# Patient Record
Sex: Male | Born: 1965
Health system: Southern US, Community
[De-identification: ages and names within clinical notes are randomized; demographics above are authoritative.]

## PROBLEM LIST (undated history)

## (undated) DIAGNOSIS — K219 Gastro-esophageal reflux disease without esophagitis: Secondary | ICD-10-CM

## (undated) DIAGNOSIS — Z8619 Personal history of other infectious and parasitic diseases: Secondary | ICD-10-CM

## (undated) HISTORY — DX: Gastro-esophageal reflux disease without esophagitis: K21.9

## (undated) HISTORY — DX: Personal history of other infectious and parasitic diseases: Z86.19

## (undated) HISTORY — PX: NO PAST SURGERIES: SHX2092

---

## 2016-03-16 ENCOUNTER — Ambulatory Visit: Payer: Self-pay | Admitting: Family Medicine

## 2017-05-02 ENCOUNTER — Telehealth: Payer: Self-pay | Admitting: Family Medicine

## 2017-05-04 ENCOUNTER — Telehealth: Payer: Self-pay

## 2017-05-04 NOTE — Telephone Encounter (Signed)
Pre visit call completed 

## 2017-05-05 ENCOUNTER — Ambulatory Visit (INDEPENDENT_AMBULATORY_CARE_PROVIDER_SITE_OTHER): Payer: Commercial Managed Care - PPO | Admitting: Family Medicine

## 2017-05-05 ENCOUNTER — Encounter: Payer: Self-pay | Admitting: Family Medicine

## 2017-05-05 VITALS — BP 120/70 | HR 85 | Temp 98.7°F | Ht 68.0 in | Wt 167.6 lb

## 2017-05-05 DIAGNOSIS — R42 Dizziness and giddiness: Secondary | ICD-10-CM | POA: Diagnosis not present

## 2017-05-05 DIAGNOSIS — R109 Unspecified abdominal pain: Secondary | ICD-10-CM | POA: Diagnosis not present

## 2017-05-05 DIAGNOSIS — Z8379 Family history of other diseases of the digestive system: Secondary | ICD-10-CM | POA: Diagnosis not present

## 2017-05-05 LAB — T4, FREE: Free T4: 0.88 ng/dL (ref 0.60–1.60)

## 2017-05-05 LAB — TSH: TSH: 1.24 u[IU]/mL (ref 0.35–4.50)

## 2017-05-05 NOTE — Patient Instructions (Addendum)
If you do not hear anything about your referral in the next 1-2 weeks, call our office and ask for an update.  Give us 4-5 business days to get the results of your labs back. I will reach out sooner if anything is abnormal with your thyroid studies.  Feel free to schedule appt with us for a complete physical at your earliest convenience as long as it has been at least a year since your last one.   Stay well hydrated and keep your salt intake up as you have been.

## 2017-05-05 NOTE — Progress Notes (Signed)
Chief Complaint  Patient presents with  . Establish Care    pt want to discuss fatigue,dizziness(every time he stands)x 6-8 weeks       New Patient Visit SUBJECTIVE: HPI: Sean Mckenzie is an 51 y.o.male who is being seen for establishing care.  The patient was previously seen at [facility]- his wife and son are seen by Dr. Abner Greenspan of this office.  Patient has an 8 week history of lightheadedness every time he rises. It lasts for several seconds and then resolves spontaneously. It is affecting his ability to stay active and work out. He is an avid marathon runner and triathlete. He denies any dizziness/spinning and has not passed out. 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 None of these have been helpful. He does not notice an increased incidence in relation to his meals. He denies any vision changes, trouble with speech, difficult swallowing, numbness, tingling, weakness, chest pain, shortness of breath, or headache. He had a fever of 102F at the end of April, and has not had any issues with this since. He does report an unintentional weight loss of around 15 pounds. He did have lab work done at his previous PCPs office prior to coming here. He had a negative CBC, CMP, Lyme disease titer, [facility] spotted fever disease titer. I do not see a thyroid panel or any autoimmune studies.  The patient has also noticed some abdominal discomfort. It is generalized and will come and go. His son and his father have Crohn's disease. While he denies any diarrhea, blood in stool, or dark tarry stools, he is very concerned about this. He has never had a colonoscopy before.   No Known Allergies  Past Medical History:  Diagnosis Date  . GERD (gastroesophageal reflux disease)   . History of chicken pox    Past Surgical History:  Procedure Laterality Date  . NO PAST SURGERIES     Social History   Social History   . Marital status: Married   Social History Main Topics  . Smoking status: Never Smoker  . Smokeless tobacco: Never Used  . Alcohol use 2.4 oz/week    4 Glasses of wine per week  . Drug use: No   Family History  Problem Relation Age of Onset  . Crohn's disease Mother   . Crohn's disease Son    Leanora Ivanoff no medications routinely.  ROS Cardiovascular: Denies chest pain  Respiratory: Denies dyspnea   OBJECTIVE: BP 120/70 (BP Location: Left Arm, Patient Position: Sitting, Cuff Size: Normal)   Pulse 85   Temp 98.7 F (37.1 C) (Oral)   Ht 5\' 8"  (1.727 m)   Wt 167 lb 9.6 oz (76 kg)   SpO2 99%   BMI 25.48 kg/m   Constitutional: -  VS reviewed -  Well developed, well nourished, appears stated age -  No apparent distress  Psychiatric: -  Oriented to person, place, and time -  Memory intact -  Affect and mood normal -  Fluent conversation, good eye contact -  Judgment and insight age appropriate  Eye: -  Conjunctivae clear, no discharge -  Pupils symmetric, round, reactive to light  ENMT: -  Ears are patent b/l without erythema or discharge. TM's are shiny and clear b/l without evidence of effusion or infection. -  Oral mucosa without lesions, tongue and uvula midline    Tonsils not enlarged, no erythema, no exudate, trachea  midline    Pharynx moist, no lesions, no erythema  Neck: -  No gross swelling, no palpable masses -  Thyroid midline, not enlarged, mobile, no palpable masses  Cardiovascular: -  RRR, no murmurs -  No LE edema  Respiratory: -  Normal respiratory effort, no accessory muscle use, no retraction -  Breath sounds equal, no wheezes, no ronchi, no crackles  Gastrointestinal: -  Bowel sounds normal -  No tenderness, no distention, no guarding, no masses  Neurological:  -  CN II - XII grossly intact -  3/4 patellar reflex, 2/4 calcaneal and biceps reflex b/l, no clonus, no cerebellar signs -  Fluent speech -  Neg Dix-[place] b/l  Musculoskeletal: -  No  clubbing, no cyanosis -  Gait normal -  5/5 strength throughout  Skin: -  No significant lesion on inspection -  Warm and dry to palpation   ASSESSMENT/PLAN: Lightheadedness - Plan: Ambulatory referral to Cardiology, TSH, T4, free, ANA,IFA RA Diag Pnl w/rflx Tit/Patn, EKG 12-Lead  Family history of Crohn's disease - Plan: Ambulatory referral to Gastroenterology  Abdominal discomfort - Plan: Ambulatory referral to Gastroenterology  Patient instructed to sign release of records form from his previous PCP. Orthostatics and EKG neg. Will complete lab workup with autoimmune studies and thyroid testing.  Refer to GI for possible colonoscopy (meets new guideline of colonoscopy at 45). Refer to cardio for eval, possible stress test.  Sounds like POTS vs dysautonomia, encouraged hydration and salt intake.  Patient should return prn at this point for CPE once he is 1 year out. The patient voiced understanding and agreement to the plan.   Jilda Rocheicholas Paul Mont BelvieuWendling, DO 05/05/17  3:39 PM

## 2017-05-06 ENCOUNTER — Telehealth: Payer: Self-pay | Admitting: Family Medicine

## 2017-05-06 LAB — ANA,IFA RA DIAG PNL W/RFLX TIT/PATN
ANA: NEGATIVE
CYCLIC CITRULLIN PEPTIDE AB: 20 U — AB
Rhuematoid fact SerPl-aCnc: <14 IU/mL (ref <14)

## 2017-05-06 MED ORDER — DICYCLOMINE HCL 20 MG PO TABS: 20.0000 mg | ORAL_TABLET | Freq: Three times a day (TID) | ORAL | 0 refills | Status: DC

## 2017-05-06 NOTE — Telephone Encounter (Signed)
Will forward request to referral team.   Keep a symptom diary. Will call in a medicine to see if that helps with the pain. If still having issues next week, schedule an appointment for a second look. TY.

## 2017-05-06 NOTE — Telephone Encounter (Signed)
Called and spoke with the pt and informed him of the message below.   Pt verbalized understanding an agreed.//AB/CMA

## 2017-05-06 NOTE — Telephone Encounter (Signed)
Relation to pt: self  Call back number: [phone]  Pharmacy:  Reason for call :  Patient wanted to inform Dr. Carmelia RollerWendling right side pain has worsened in need of clinical advice and would like [facility]: Charna ElizabethMann Sean Mckenzie [address], [place], [place] [address] [phone]  instead of [facility]

## 2017-05-09 ENCOUNTER — Encounter (HOSPITAL_BASED_OUTPATIENT_CLINIC_OR_DEPARTMENT_OTHER): Payer: Self-pay | Admitting: *Deleted

## 2017-05-09 ENCOUNTER — Emergency Department (HOSPITAL_BASED_OUTPATIENT_CLINIC_OR_DEPARTMENT_OTHER)
Admission: EM | Admit: 2017-05-09 | Discharge: 2017-05-10 | Disposition: A | Discharge status: Home / Self Care | Payer: Commercial Managed Care - PPO | Attending: Emergency Medicine | Admitting: Emergency Medicine

## 2017-05-09 DIAGNOSIS — R1011 Right upper quadrant pain: Secondary | ICD-10-CM | POA: Diagnosis not present

## 2017-05-09 DIAGNOSIS — R109 Unspecified abdominal pain: Secondary | ICD-10-CM | POA: Diagnosis present

## 2017-05-09 LAB — BASIC METABOLIC PANEL
ANION GAP: 8 (ref 5–15)
BUN: 13 mg/dL (ref 6–20)
CHLORIDE: 103 mmol/L (ref 101–111)
CO2: 27 mmol/L (ref 22–32)
CREATININE: 0.91 mg/dL (ref 0.61–1.24)
Calcium: 9 mg/dL (ref 8.9–10.3)
GFR calc Af Amer: >60 mL/min (ref >60)
GFR calc non Af Amer: >60 mL/min (ref >60)
GLUCOSE: 102 mg/dL — AB (ref 65–99)
Potassium: 3.6 mmol/L (ref 3.5–5.1)
Sodium: 138 mmol/L (ref 135–145)

## 2017-05-09 LAB — CBC WITH DIFFERENTIAL/PLATELET
Basophils Absolute: 0 10*3/uL (ref 0.0–0.1)
Basophils Relative: 1 %
Eosinophils Absolute: 0.1 10*3/uL (ref 0.0–0.7)
Eosinophils Relative: 2 %
HEMATOCRIT: 40.1 % (ref 39.0–52.0)
HEMOGLOBIN: 14 g/dL (ref 13.0–17.0)
LYMPHS ABS: 2.2 10*3/uL (ref 0.7–4.0)
Lymphocytes Relative: 38 %
MCH: 31 pg (ref 26.0–34.0)
MCHC: 34.9 g/dL (ref 30.0–36.0)
MCV: 88.7 fL (ref 78.0–100.0)
MONO ABS: 0.5 10*3/uL (ref 0.1–1.0)
MONOS PCT: 8 %
NEUTROS ABS: 2.9 10*3/uL (ref 1.7–7.7)
NEUTROS PCT: 51 %
Platelets: 292 10*3/uL (ref 150–400)
RBC: 4.52 MIL/uL (ref 4.22–5.81)
RDW: 12 % (ref 11.5–15.5)
WBC: 5.7 10*3/uL (ref 4.0–10.5)

## 2017-05-09 LAB — AMYLASE: Amylase: 44 U/L (ref 28–100)

## 2017-05-09 LAB — LIPASE, BLOOD: Lipase: 38 U/L (ref 11–51)

## 2017-05-09 NOTE — ED Triage Notes (Signed)
Right upper quadrant pain. He was seen here 4 days ago for undiagnosed lower abdominal pain.

## 2017-05-10 ENCOUNTER — Emergency Department (HOSPITAL_BASED_OUTPATIENT_CLINIC_OR_DEPARTMENT_OTHER): Payer: Commercial Managed Care - PPO

## 2017-05-10 ENCOUNTER — Other Ambulatory Visit: Payer: Self-pay | Admitting: Gastroenterology

## 2017-05-10 DIAGNOSIS — R1033 Periumbilical pain: Secondary | ICD-10-CM

## 2017-05-10 LAB — HEPATIC FUNCTION PANEL
ALT: 14 U/L — ABNORMAL LOW (ref 17–63)
AST: 18 U/L (ref 15–41)
Albumin: 4.3 g/dL (ref 3.5–5.0)
Alkaline Phosphatase: 44 U/L (ref 38–126)
Bilirubin, Direct: <0.1 mg/dL — ABNORMAL LOW (ref 0.1–0.5)
TOTAL PROTEIN: 6.8 g/dL (ref 6.5–8.1)
Total Bilirubin: 0.3 mg/dL (ref 0.3–1.2)

## 2017-05-10 MED ORDER — IOPAMIDOL (ISOVUE-300) INJECTION 61%
100.0000 mL | Freq: Once | INTRAVENOUS | Status: AC | PRN
  Administered 2017-05-10: 100 mL via INTRAVENOUS

## 2017-05-10 NOTE — ED Provider Notes (Signed)
[facility] Provider Note: Sean DellJ. Lane Sean Botting, MD, FACEP  CSN: [id number] MRN: [id number] ARRIVAL: 05/09/17 at 2102 ROOM: MH02/MH02   CHIEF COMPLAINT  Abdominal Pain   HISTORY OF PRESENT ILLNESS  Sean Mckenzie is a 51 y.o. male with a three-month history of vaguely described malaise. He has been having generalized abdominal discomfort and transient lightheadedness when bending over. He has had a weight loss during this same period of time. He previously was a runner but has not felt like running for the past several months. He was seen in the clinic upstairs on the 14th of this month; lab work was reportedly unremarkable. He was referred to Dr. Loreta AveMann of gastroenterology and he has an appointment with her this afternoon at 4 PM. He is here now because of several days of right upper quadrant pain. The pain was initially intermittent but is now fairly constant. He rates it as a 4 out of 10 but it is worse after eating. The pain radiates to his right flank and right shoulder. It is a dull pain. It is somewhat worse with movement or palpation. He denies associated nausea, vomiting or diarrhea.   Past Medical History:  Diagnosis Date  . GERD (gastroesophageal reflux disease)   . History of chicken pox     Past Surgical History:  Procedure Laterality Date  . NO PAST SURGERIES      Family History  Problem Relation Age of Onset  . Crohn's disease Mother   . Crohn's disease Son     Social History  Substance Use Topics  . Smoking status: Never Smoker  . Smokeless tobacco: Never Used  . Alcohol use 2.4 oz/week    4 Glasses of wine per week    Prior to Admission medications   Medication Sig Start Date End Date Taking? Authorizing Provider  dicyclomine (BENTYL) 20 MG tablet Take 1 tablet (20 mg total) by mouth 3 (three) times daily before meals. 05/06/17   Sharlene DoryWendling, Nicholas Paul, DO    Allergies Patient has no known allergies.   REVIEW OF SYSTEMS  Negative except as noted  here or in the History of Present Illness.   PHYSICAL EXAMINATION  Initial Vital Signs Blood pressure (!) 131/91, pulse (!) 54, resp. rate 18, height 5\' 8"  (1.727 m), weight 75.8 kg (167 lb), SpO2 100 %.  Examination General: Well-developed, well-nourished male in no acute distress; appearance consistent with age of record HENT: normocephalic; atraumatic Eyes: pupils equal, round and reactive to light; extraocular muscles intact Neck: supple Heart: regular rate and rhythm Lungs: clear to auscultation bilaterally Abdomen: soft; nondistended; mild right upper quadrant tenderness; no masses or hepatosplenomegaly; bowel sounds present; no gallstones seen on bedside ultrasound Extremities: No deformity; full range of motion; pulses normal Neurologic: Awake, alert and oriented; motor function intact in all extremities and symmetric; no facial droop Skin: Warm and dry Psychiatric: Normal mood and affect   RESULTS  Summary of this visit's results, reviewed by myself:   EKG Interpretation  Date/Time:    Ventricular Rate:    PR Interval:    QRS Duration:   QT Interval:    QTC Calculation:   R Axis:     Text Interpretation:        Laboratory Studies: Results for orders placed or performed during the hospital encounter of 05/09/17 (from the past 24 hour(s))  Basic metabolic panel     Status: Abnormal   Collection Time: 05/09/17  9:16 PM  Result Value Ref Range  Sodium 138 135 - 145 mmol/L   Potassium 3.6 3.5 - 5.1 mmol/L   Chloride 103 101 - 111 mmol/L   CO2 27 22 - 32 mmol/L   Glucose, Bld 102 (H) 65 - 99 mg/dL   BUN 13 6 - 20 mg/dL   Creatinine, Ser 1.61 0.61 - 1.24 mg/dL   Calcium 9.0 8.9 - 09.6 mg/dL   GFR calc non Af Amer >60 >60 mL/min   GFR calc Af Amer >60 >60 mL/min   Anion gap 8 5 - 15  CBC with Differential     Status: None   Collection Time: 05/09/17  9:16 PM  Result Value Ref Range   WBC 5.7 4.0 - 10.5 K/uL   RBC 4.52 4.22 - 5.81 MIL/uL   Hemoglobin 14.0  13.0 - 17.0 g/dL   HCT 04.5 40.9 - 81.1 %   MCV 88.7 78.0 - 100.0 fL   MCH 31.0 26.0 - 34.0 pg   MCHC 34.9 30.0 - 36.0 g/dL   RDW 91.4 78.2 - 95.6 %   Platelets 292 150 - 400 K/uL   Neutrophils Relative % 51 %   Neutro Abs 2.9 1.7 - 7.7 K/uL   Lymphocytes Relative 38 %   Lymphs Abs 2.2 0.7 - 4.0 K/uL   Monocytes Relative 8 %   Monocytes Absolute 0.5 0.1 - 1.0 K/uL   Eosinophils Relative 2 %   Eosinophils Absolute 0.1 0.0 - 0.7 K/uL   Basophils Relative 1 %   Basophils Absolute 0.0 0.0 - 0.1 K/uL  Amylase     Status: None   Collection Time: 05/09/17  9:16 PM  Result Value Ref Range   Amylase 44 28 - 100 U/L  Lipase, blood     Status: None   Collection Time: 05/09/17  9:16 PM  Result Value Ref Range   Lipase 38 11 - 51 U/L  Hepatic function panel     Status: Abnormal   Collection Time: 05/09/17  9:16 PM  Result Value Ref Range   Total Protein 6.8 6.5 - 8.1 g/dL   Albumin 4.3 3.5 - 5.0 g/dL   AST 18 15 - 41 U/L   ALT 14 (L) 17 - 63 U/L   Alkaline Phosphatase 44 38 - 126 U/L   Total Bilirubin 0.3 0.3 - 1.2 mg/dL   Bilirubin, Direct <2.1 (L) 0.1 - 0.5 mg/dL   Indirect Bilirubin NOT CALCULATED 0.3 - 0.9 mg/dL   Imaging Studies: Ct Abdomen Pelvis W Contrast  Result Date: 05/10/2017 CLINICAL DATA:  51 year old male with right-sided abdominal pain. Family history of Crohn's disease. EXAM: CT ABDOMEN AND PELVIS WITH CONTRAST TECHNIQUE: Multidetector CT imaging of the abdomen and pelvis was performed using the standard protocol following bolus administration of intravenous contrast. CONTRAST:  ISOVUE-300 IOPAMIDOL (ISOVUE-300) INJECTION 61% COMPARISON:  None. FINDINGS: Lower chest: The visualized lung bases are clear. No intra-abdominal free air or free fluid. Hepatobiliary: No focal liver abnormality is seen. No gallstones, gallbladder wall thickening, or biliary dilatation. Pancreas: Unremarkable. No pancreatic ductal dilatation or surrounding inflammatory changes. Spleen: Normal  in size without focal abnormality. Adrenals/Urinary Tract: Adrenal glands are unremarkable. Kidneys are normal, without renal calculi, focal lesion, or hydronephrosis. Bladder is unremarkable. Stomach/Bowel: There is sigmoid diverticulosis without active inflammatory changes. Moderate amount of stool noted in the distal colon and rectosigmoid. There is no evidence of bowel obstruction or active inflammation. Normal appendix. Vascular/Lymphatic: No significant vascular findings are present. No enlarged abdominal or pelvic lymph nodes. Reproductive: The  prostate and seminal vesicles are grossly unremarkable. Other: No abdominal wall hernia or abnormality. No abdominopelvic ascites. Musculoskeletal: No acute or significant osseous findings. IMPRESSION: No acute intra-abdominopelvic pathology. Colonic diverticulosis. No evidence of bowel obstruction or active inflammation. Normal appendix. Electronically Signed   By: Elgie Collard M.D.   On: 05/10/2017 03:35    ED COURSE  Nursing notes and initial vitals signs, including pulse oximetry, reviewed.  Vitals:   05/09/17 2109 05/10/17 0029  BP:  (!) 131/91  Pulse:  (!) 54  Resp:  18  SpO2:  100%  Weight: 75.8 kg (167 lb)   Height: 5\' 8"  (1.727 m)    Patient advised of reassuring CT scans and lab work. He will follow-up with Dr. Loreta Ave this afternoon as scheduled.  PROCEDURES    ED DIAGNOSES     ICD-10-CM   1. Right upper quadrant abdominal pain R10.11        Tiyanna Larcom, MD 05/10/17 0405

## 2017-05-10 NOTE — ED Notes (Addendum)
Patient denies of n/v.  Pt also stated that [facility] April 2018, [facility], he has traces of blood in his urine.

## 2017-05-12 ENCOUNTER — Ambulatory Visit (HOSPITAL_COMMUNITY)
Admission: RE | Admit: 2017-05-12 | Discharge: 2017-05-12 | Disposition: A | Discharge status: Home / Self Care | Payer: Commercial Managed Care - PPO | Source: Ambulatory Visit | Attending: Gastroenterology | Admitting: Gastroenterology

## 2017-05-12 DIAGNOSIS — R1033 Periumbilical pain: Secondary | ICD-10-CM | POA: Diagnosis not present

## 2017-05-16 ENCOUNTER — Encounter (HOSPITAL_COMMUNITY): Payer: Commercial Managed Care - PPO

## 2017-05-23 ENCOUNTER — Encounter (HOSPITAL_COMMUNITY): Payer: Commercial Managed Care - PPO

## 2017-12-08 IMAGING — US US ABDOMEN COMPLETE
1 series · 14 of 25 positions shown · non-contrast
Comparison: CT scan of May 10, 2017.

CLINICAL DATA: Periumbilical abdominal pain for 1 month.

EXAM:
ABDOMEN ULTRASOUND COMPLETE

[Series 1: us abdomen complete · 0.23mm/px · 14 of 99 slices shown]
[im 1/99]
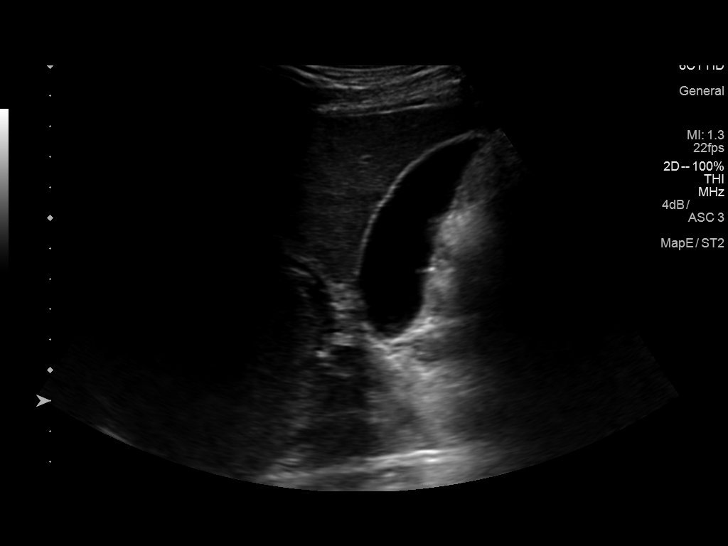
[im 9/99]
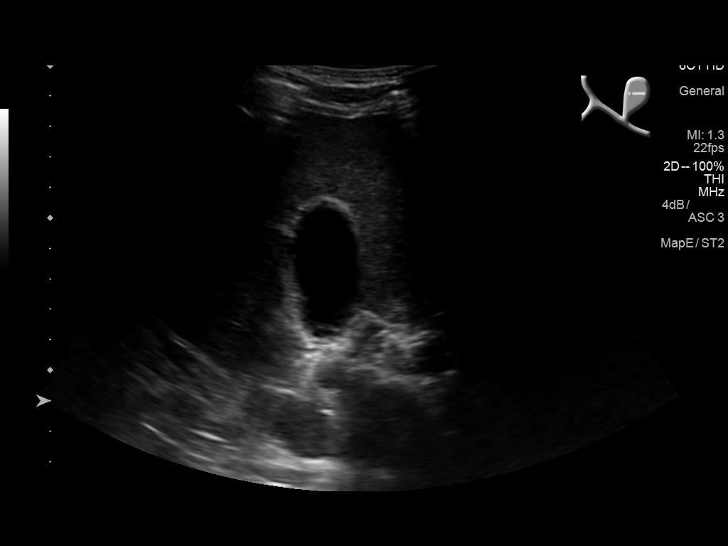
[im 17/99]
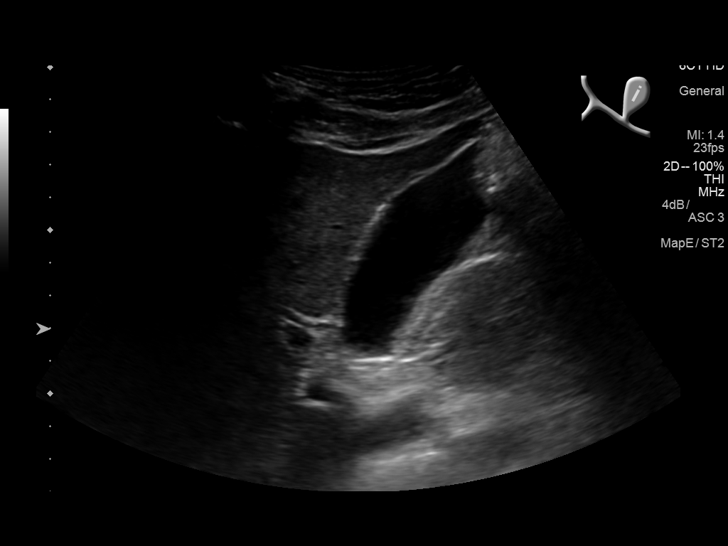
[im 25/99]
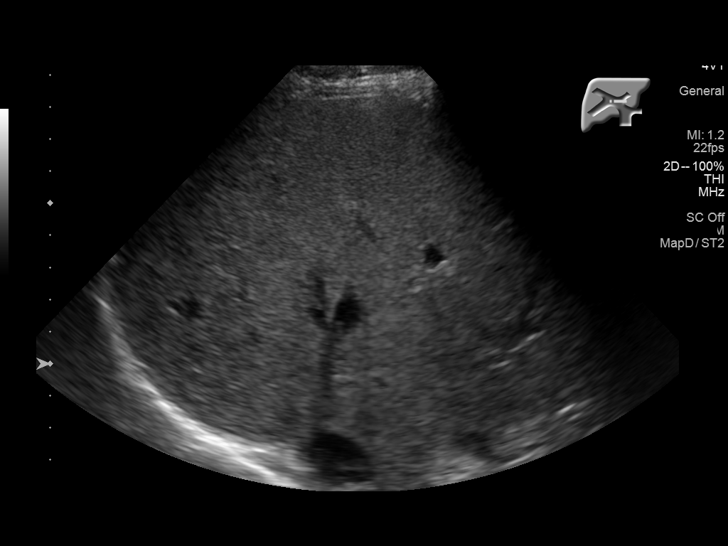
[im 33/99]
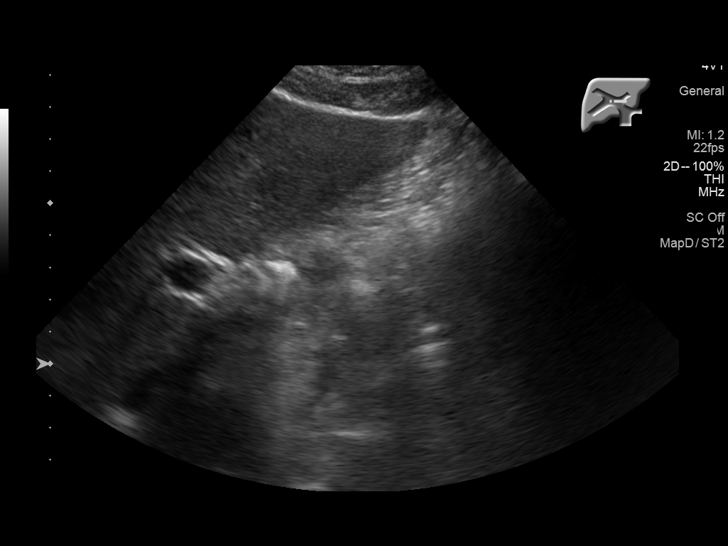
[im 37/99]
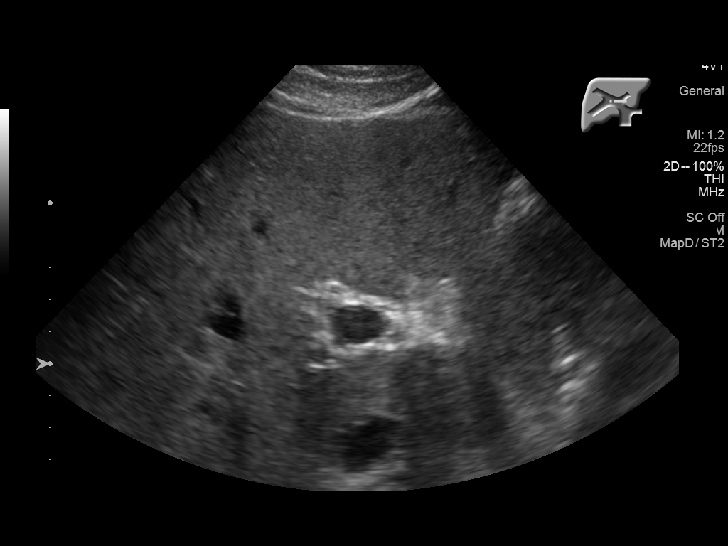
[im 45/99]
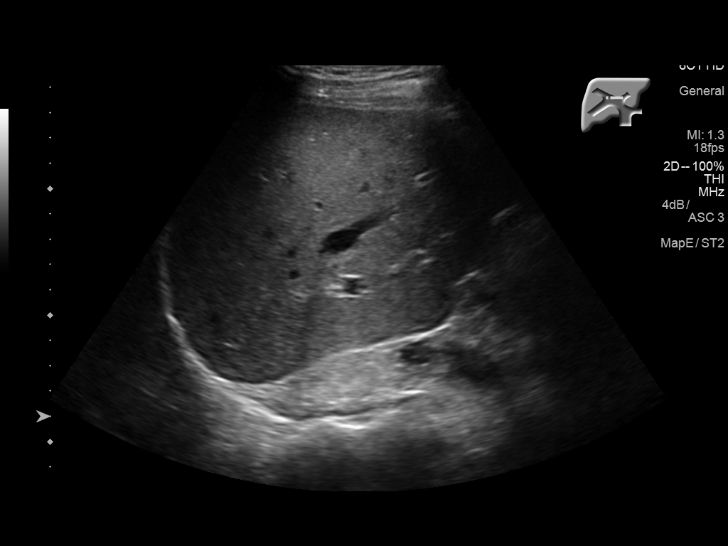
[im 54/99]
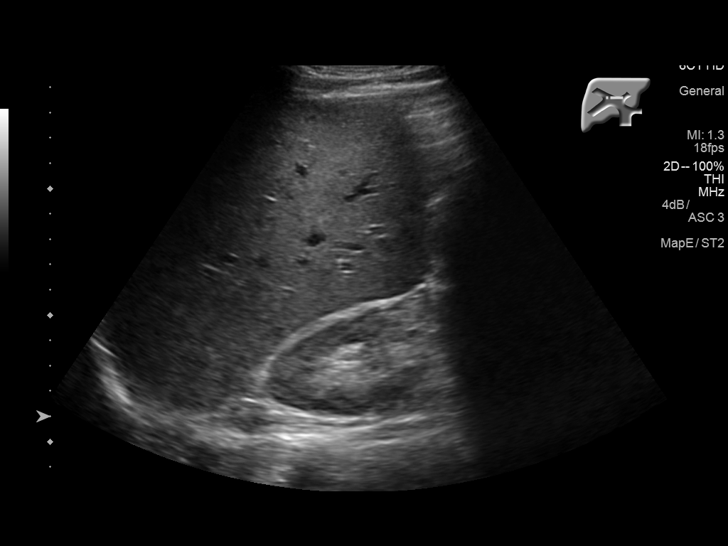
[im 62/99]
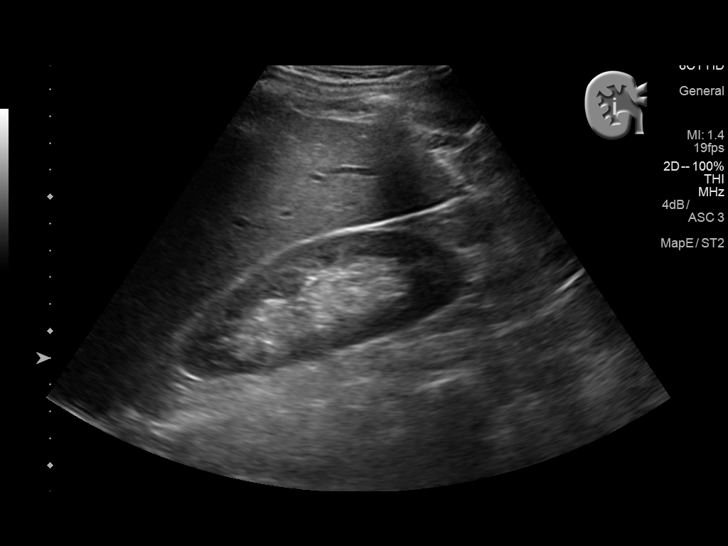
[im 66/99]
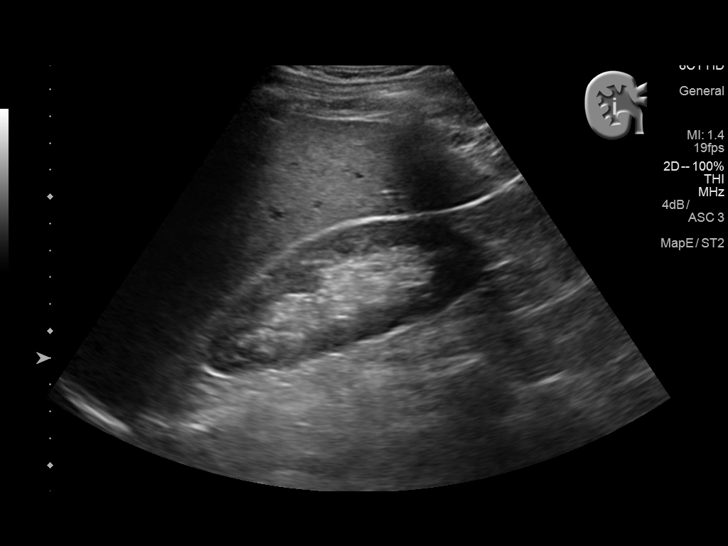
[im 74/99]
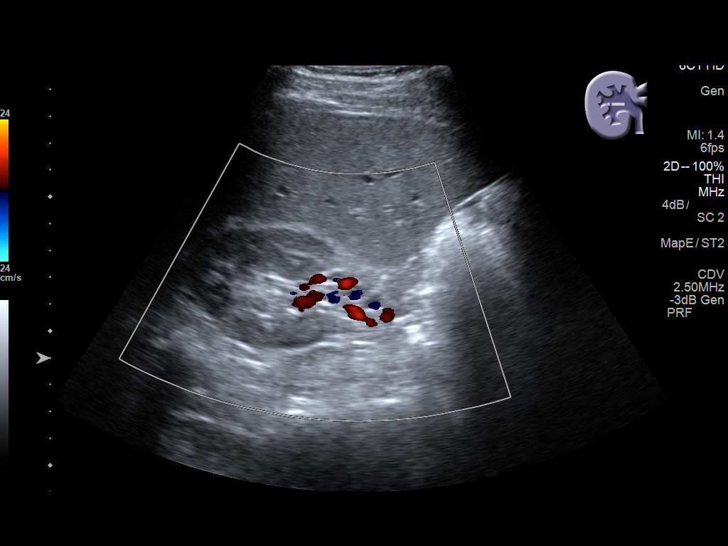
[im 82/99]
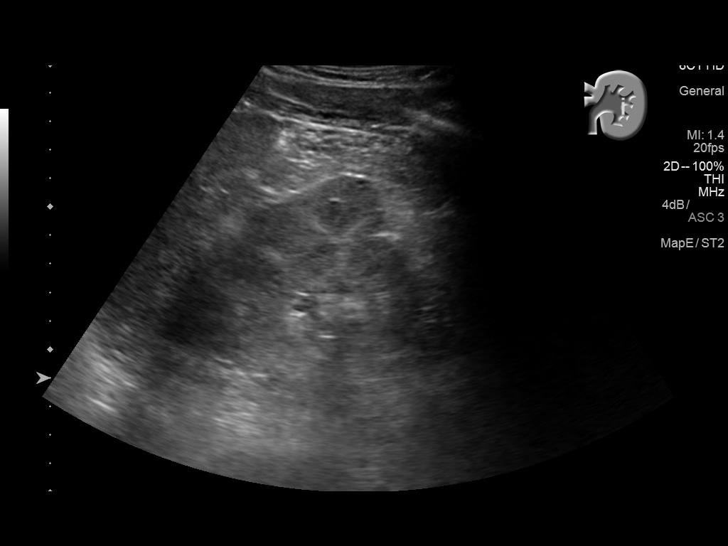
[im 90/99]
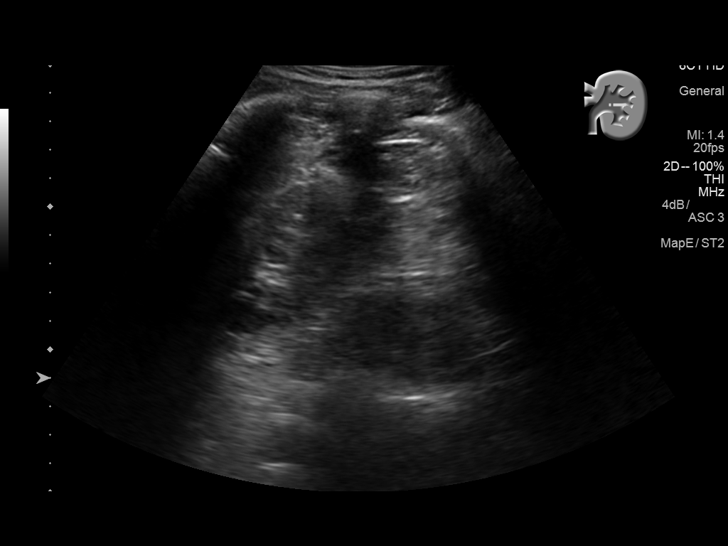
[im 99/99]
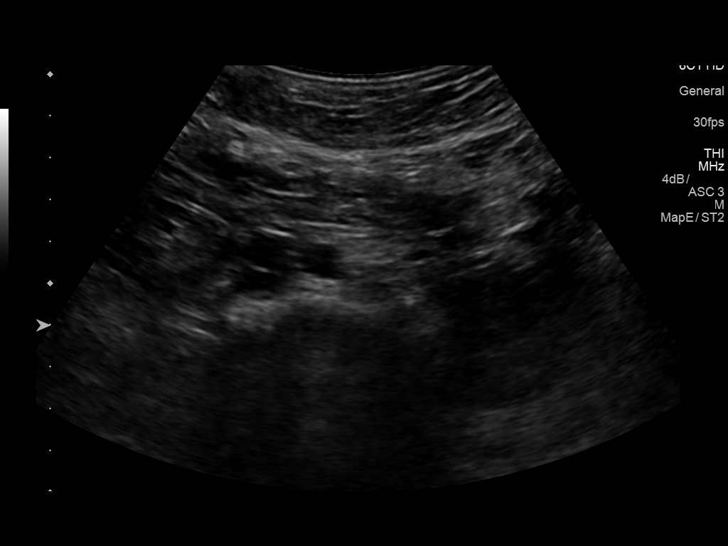

[14 of 25 positions shown; findings below may reference images not displayed]

FINDINGS: Gallbladder: No gallstones or wall thickening visualized. No
sonographic Murphy sign noted by sonographer.

Common bile duct: Diameter: 4.6 mm which is within normal limits.

Liver: No focal lesion identified. Within normal limits in
parenchymal echogenicity.

IVC: No abnormality visualized.

Pancreas: Visualized portion unremarkable.

Spleen: Size and appearance within normal limits.

Right Kidney: Length: 11.7 cm. Echogenicity within normal limits. No
mass or hydronephrosis visualized.

Left Kidney: Length: 11.5 cm. Echogenicity within normal limits. No
mass or hydronephrosis visualized.

Abdominal aorta: No aneurysm visualized.

Other findings: None.
IMPRESSION: No abnormality seen in the abdomen.

## 2018-04-23 IMAGING — CT CT ABD-PELV W/ CM
2 of 5 series · 15 of 46 positions shown, 17 images · IV contrast (APPLIED)
Comparison: None.

CLINICAL DATA: 50-year-old male with right-sided abdominal pain.
Family history of Crohn's disease.

EXAM:
CT ABDOMEN AND PELVIS WITH CONTRAST
TECHNIQUE: Multidetector CT imaging of the abdomen and pelvis was performed
using the standard protocol following bolus administration of
intravenous contrast.
CONTRAST:  100mL HAOTZ5-UZZ IOPAMIDOL (HAOTZ5-UZZ) INJECTION 61%

[Series 2: axial st · axial · 0.77mm/px · z∈[-499,-34]mm · 12 of 105 slices shown, 14 images]
[im 6/105  soft-tissue]
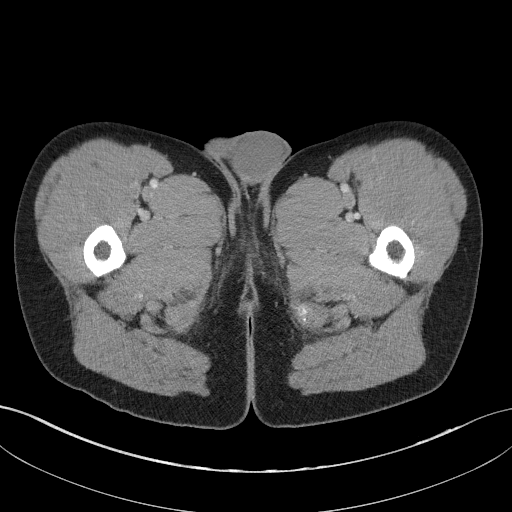
[im 6/105  bone]
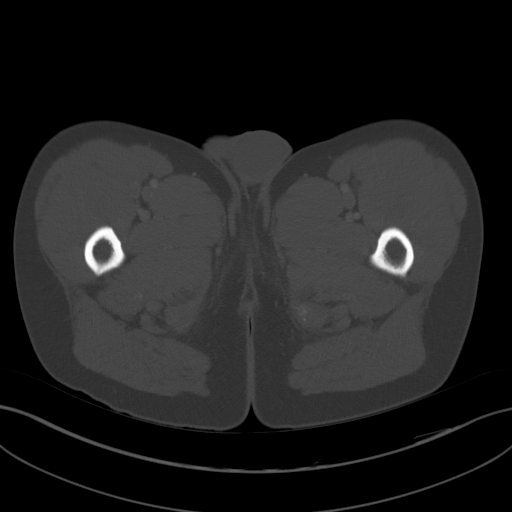
[im 17/105  soft-tissue]
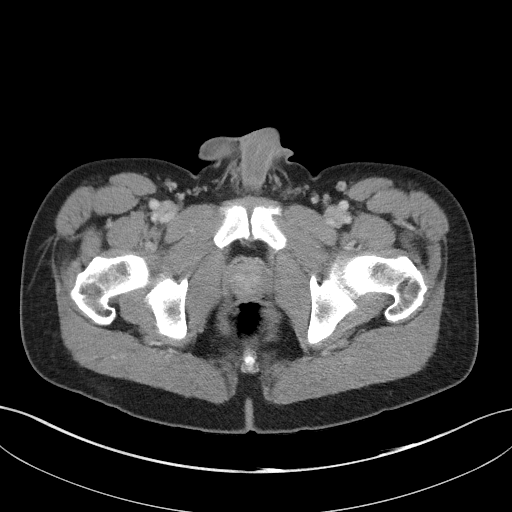
[im 22/105  soft-tissue]
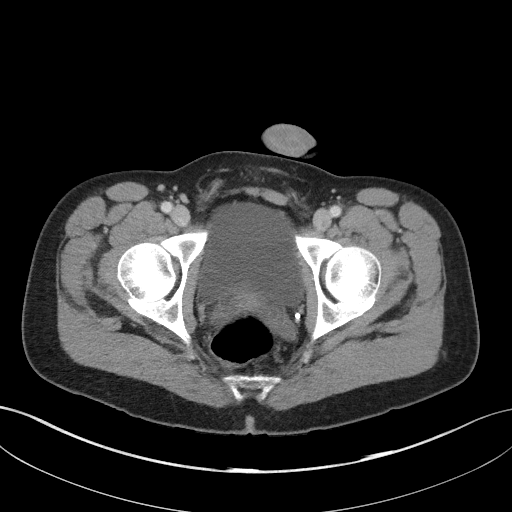
[im 33/105  soft-tissue]
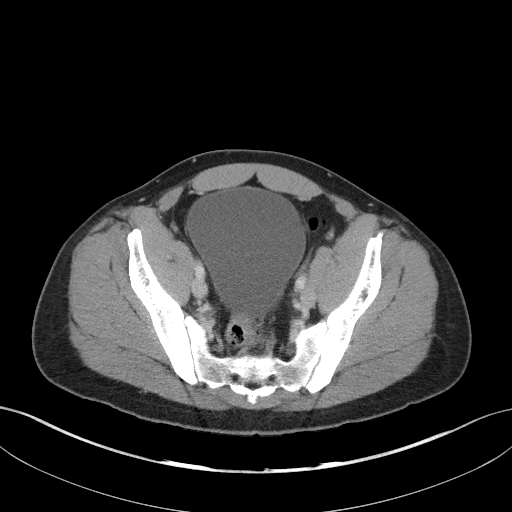
[im 39/105  soft-tissue]
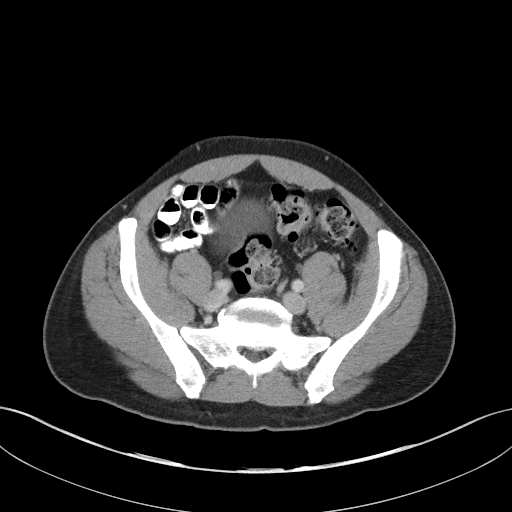
[im 50/105  soft-tissue]
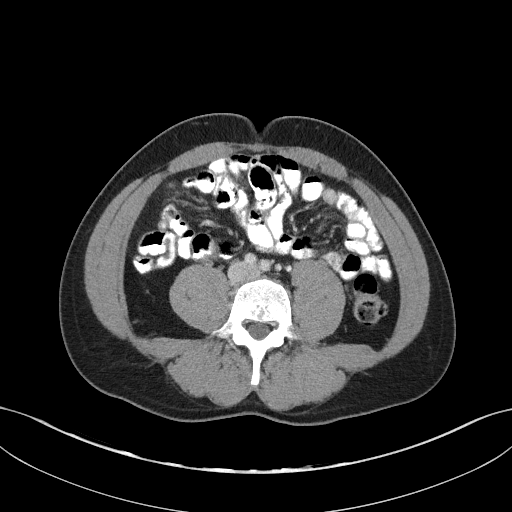
[im 55/105  soft-tissue]
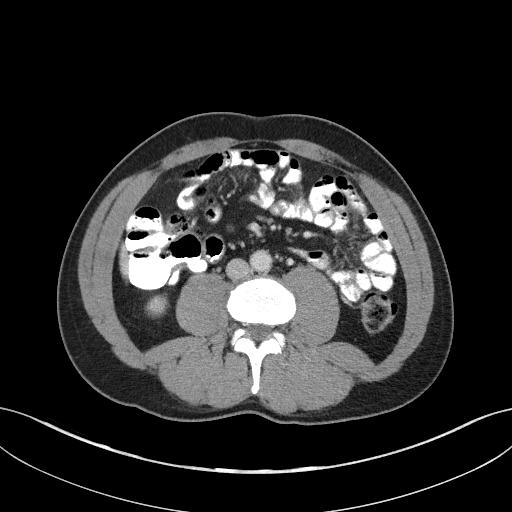
[im 66/105  soft-tissue]
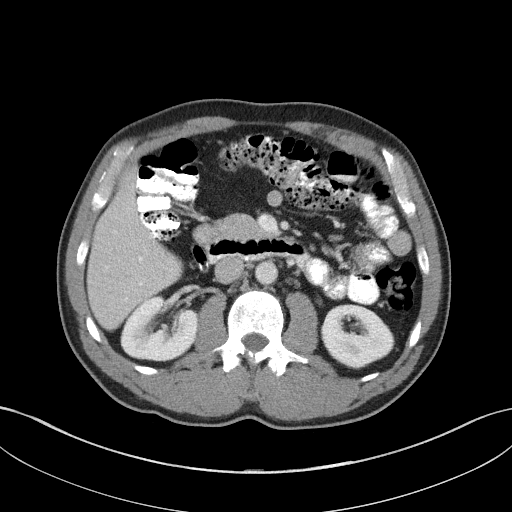
[im 72/105  soft-tissue]
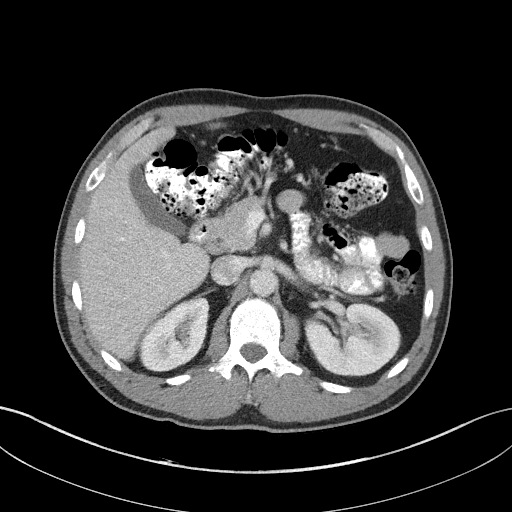
[im 72/105  bone]
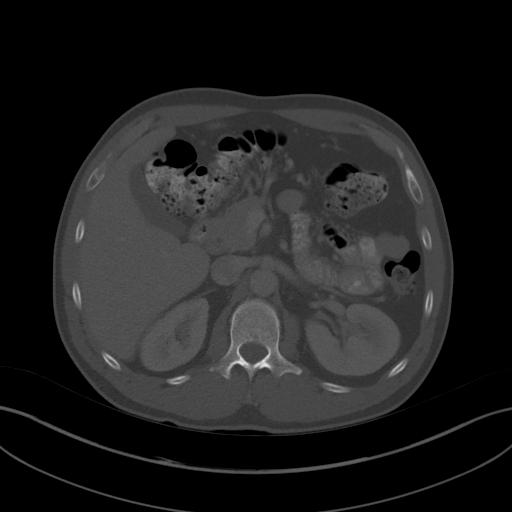
[im 83/105  soft-tissue]
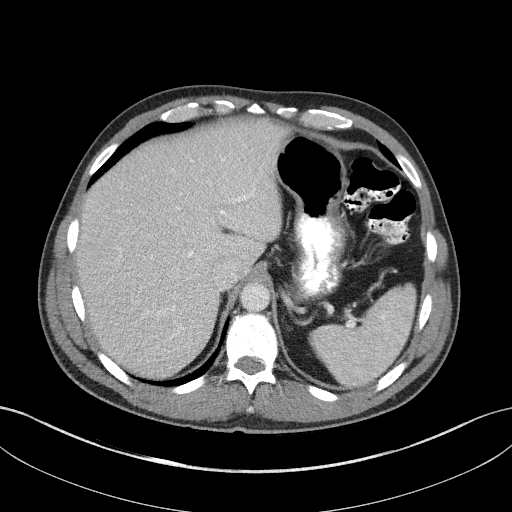
[im 88/105  soft-tissue]
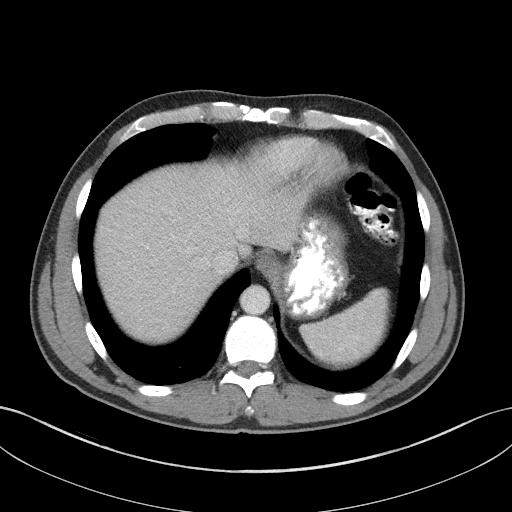
[im 99/105  soft-tissue]
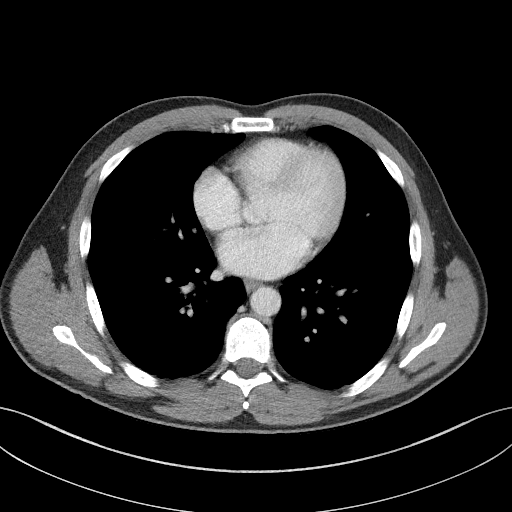

[Series 5: coronal st · coronal · 0.70mm/px · 3 of 83 slices shown]
[im 28/83  soft-tissue]
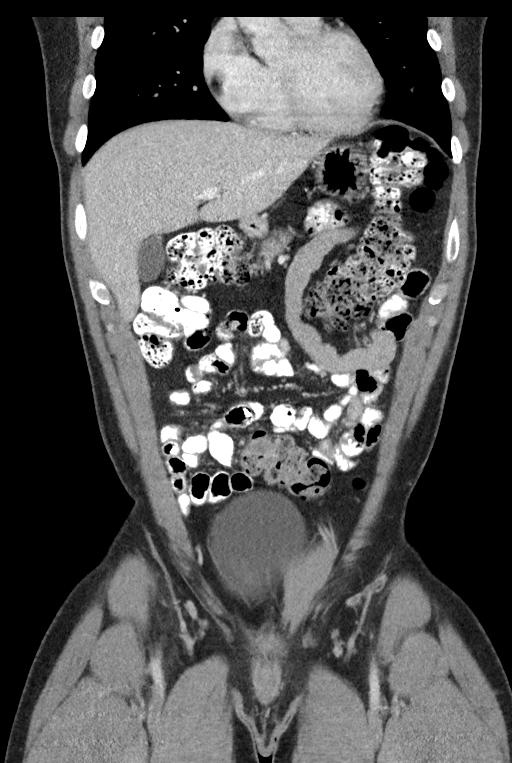
[im 37/83  soft-tissue]
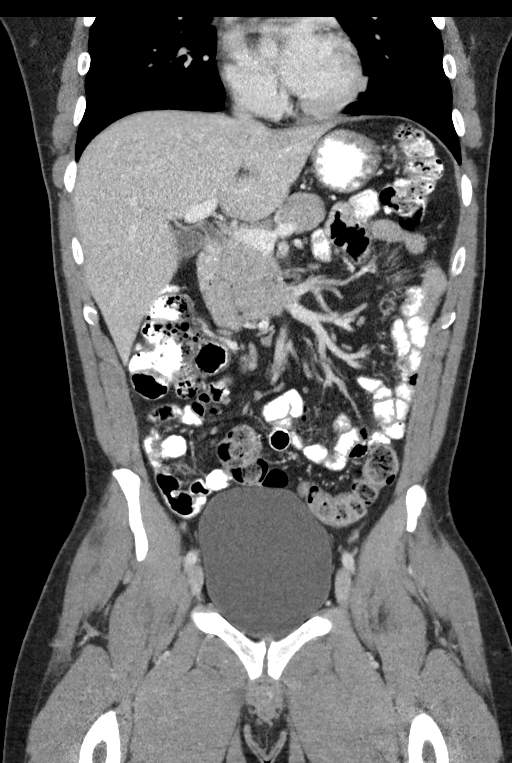
[im 46/83  soft-tissue]
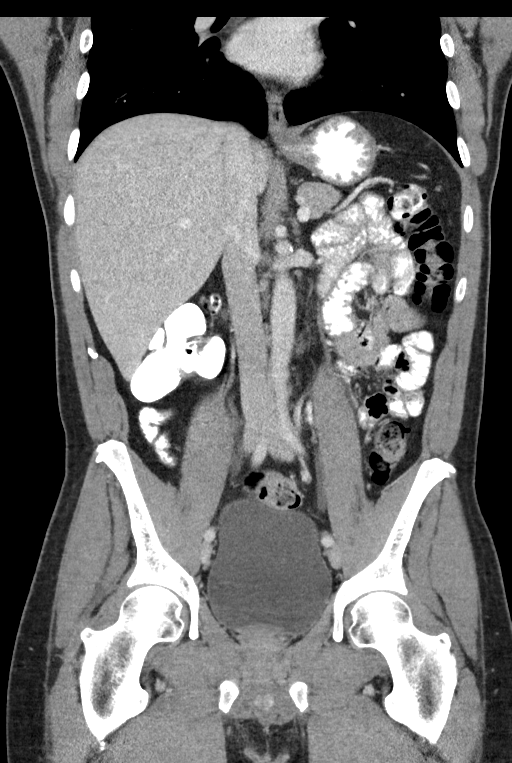

[15 of 46 positions shown; findings below may reference images not displayed]

FINDINGS: Lower chest: The visualized lung bases are clear.

No intra-abdominal free air or free fluid.

Hepatobiliary: No focal liver abnormality is seen. No gallstones,
gallbladder wall thickening, or biliary dilatation.

Pancreas: Unremarkable. No pancreatic ductal dilatation or
surrounding inflammatory changes.

Spleen: Normal in size without focal abnormality.

Adrenals/Urinary Tract: Adrenal glands are unremarkable. Kidneys are
normal, without renal calculi, focal lesion, or hydronephrosis.
Bladder is unremarkable.

Stomach/Bowel: There is sigmoid diverticulosis without active
inflammatory changes. Moderate amount of stool noted in the distal
colon and rectosigmoid. There is no evidence of bowel obstruction or
active inflammation. Normal appendix.

Vascular/Lymphatic: No significant vascular findings are present. No
enlarged abdominal or pelvic lymph nodes.

Reproductive: The prostate and seminal vesicles are grossly
unremarkable.

Other: No abdominal wall hernia or abnormality. No abdominopelvic
ascites.

Musculoskeletal: No acute or significant osseous findings.
IMPRESSION: No acute intra-abdominopelvic pathology. Colonic diverticulosis. No
evidence of bowel obstruction or active inflammation. Normal
appendix.

## 2018-05-08 ENCOUNTER — Ambulatory Visit (INDEPENDENT_AMBULATORY_CARE_PROVIDER_SITE_OTHER): Payer: Commercial Managed Care - PPO | Admitting: Family Medicine

## 2018-05-08 ENCOUNTER — Encounter: Payer: Self-pay | Admitting: Family Medicine

## 2018-05-08 VITALS — BP 118/70 | HR 81 | Temp 98.8°F | Ht 68.0 in | Wt 172.4 lb

## 2018-05-08 DIAGNOSIS — J029 Acute pharyngitis, unspecified: Secondary | ICD-10-CM | POA: Diagnosis not present

## 2018-05-08 LAB — POCT RAPID STREP A (OFFICE): RAPID STREP A SCREEN: NEGATIVE

## 2018-05-08 MED ORDER — AZITHROMYCIN 250 MG PO TABS: ORAL_TABLET | ORAL | 0 refills | Status: DC

## 2018-05-08 NOTE — Progress Notes (Signed)
Pre visit review using our clinic review tool, if applicable. No additional management support is needed unless otherwise documented below in the visit note. 

## 2018-05-08 NOTE — Patient Instructions (Signed)
Don't take the antibiotic right away. If you start having fevers again or get worse, go ahead and take it.  For symptoms, consider using Vick's VapoRub on chest or under nose, air humidifier, and elevating the head of the bed. Consider Allegra or Zyrtec to help dry you up.  Mucinex may be helpful. Tylenol and ibuprofen for aches and pains you may be experiencing.   Continue to push fluids, practice good hand hygiene, and cover your mouth if you cough.  If you start having fevers, shaking or shortness of breath, seek immediate care.  Let us know if you need anything.

## 2018-05-08 NOTE — Progress Notes (Signed)
Chief Complaint  Patient presents with  . Nasal Congestion  . Sore Throat    Sean MillinerPeter Mckenzie here for URI complaints.  Duration: 3 days  Associated symptoms: sinus congestion, rhinorrhea, sore throat and cough Denies: sinus pain, itchy watery eyes, ear pain, ear drainage, wheezing, shortness of breath and current fever (had 101.6 F yesterday)  Treatment to date: Tylenol Sick contacts: No  ROS:  Const: Denies current  fevers HEENT: As noted in HPI Lungs: No SOB  Past Medical History:  Diagnosis Date  . GERD (gastroesophageal reflux disease)   . History of chicken pox    Family History  Problem Relation Age of Onset  . Crohn's disease Mother   . Crohn's disease Son     BP 118/70 (BP Location: Left Arm, Patient Position: Sitting, Cuff Size: Normal)   Pulse 81   Temp 98.8 F (37.1 C) (Oral)   Ht 5\' 8"  (1.727 m)   Wt 172 lb 6 oz (78.2 kg)   SpO2 98%   BMI 26.21 kg/m  General: Awake, alert, appears stated age HEENT: AT, [place], ears patent b/l and TM's neg, nares patent w/o discharge, pharynx pink and without exudates, MMM Neck: No masses or asymmetry Heart: RRR Lungs: CTAB, no accessory muscle use Psych: Age appropriate judgment and insight, normal mood and affect  Sore throat - Plan: POCT rapid strep A  Rapid strep neg. Zpak for contigency given. I told him I don't think he needs abx, but if fever returns, go ahead and use it. Supp care otherwise.  Continue to push fluids, practice good hand hygiene, cover mouth when coughing. F/u prn. Pt voiced understanding and agreement to the plan.  Sean Rocheicholas Paul Sel[place]Wendling, DO 05/08/18 3:11 PM

## 2018-05-10 ENCOUNTER — Encounter: Payer: Self-pay | Admitting: Family Medicine

## 2019-03-01 ENCOUNTER — Other Ambulatory Visit: Payer: Self-pay

## 2019-03-01 ENCOUNTER — Ambulatory Visit (INDEPENDENT_AMBULATORY_CARE_PROVIDER_SITE_OTHER): Payer: Commercial Managed Care - PPO | Admitting: Family Medicine

## 2019-03-01 ENCOUNTER — Ambulatory Visit: Payer: Self-pay

## 2019-03-01 ENCOUNTER — Encounter: Payer: Self-pay | Admitting: Family Medicine

## 2019-03-01 VITALS — Temp 98.4°F

## 2019-03-01 DIAGNOSIS — R5381 Other malaise: Secondary | ICD-10-CM

## 2019-03-01 DIAGNOSIS — R6889 Other general symptoms and signs: Secondary | ICD-10-CM

## 2019-03-01 NOTE — Progress Notes (Addendum)
Virtual Visit via Video Note  I connected with patient on 03/01/19 at  1:15 PM EDT by a video enabled telemedicine application and verified that I am speaking with the correct person using two identifiers.   I discussed the limitations of evaluation and management by telemedicine and the availability of in person appointments. The patient expressed understanding and agreed to proceed.  History of Present Illness: Pt has had a malaise over past couple weeks. Has had low grade fevers, early episodes of diarrhea, ST. 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  Most recent fever was low grade in low 100's and high 99's 2 d ago. No other sick contacts. Has not tried anything at home thus far.    Observations/Objective: No conversational dyspnea Age appropriate judgment and insight Nml affect and mood  Assessment and Plan: Flu-like symptoms  Supportive care. I think there is a reasonable chance he has COVID19. I would like to be able to test him, but we lack the resources. He is doing fairly well currently. Warning signs and symptoms verbalized and written down in a MyChart message. Return to work criteria discussed and also sent via message.   Follow Up Instructions: PRN   I discussed the assessment and treatment plan with the patient. The patient was provided an opportunity to ask questions and all were answered. The patient agreed with the plan and demonstrated an understanding of the instructions.   The patient was advised to call back or seek an in-person evaluation if the symptoms worsen or if the condition fails to improve as anticipated.   Jilda Roche [place], DO

## 2019-03-01 NOTE — Telephone Encounter (Signed)
Called left message to schedule virtual visit.

## 2019-03-01 NOTE — Telephone Encounter (Signed)
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 He says he was not in direct contact with those people. He says over the last 2 weeks, he's had 1 episode of diarrhea, low grade fevers a couple of days ranging from 99.7-100.3, but no fever today. He says today he's still with a mild headache, fatigue, mild sore throat, denies cough, denies SOB. He says he's been working over the 2 weeks wearing masks and gloves. He says he was with his patient a total of 45 minutes and she didn't have any symptoms. I advised self isolation for 3 days after the symptoms have gone away plus 7 days from the last exposure, which he hasn't been in the facility in 2 weeks. I advised care advice. I advised I will send this note to Dr. Carmelia Roller and someone will call with his recommendation, he verbalized understanding.   Reason for Disposition . [1] Mild body aches, chills, diarrhea, headache, runny nose, or sore throat AND [2] within 14 days of COVID-Exposure  Answer Assessment - Initial Assessment Questions 1. CLOSE CONTACT: "Who is the person with the confirmed or suspected COVID-19 infection that you were exposed to?"     Assisted Living Building where 3 people positive for COVID-19 2. PLACE of CONTACT: "Where were you when you were exposed to COVID-19?" (e.g., home, school, medical waiting room; which city?)     [place], [place] 3. TYPE of CONTACT: "How much contact was there?" (e.g., sitting next to, live in same house, work in same office, same building)     Same building 4. DURATION of CONTACT: "How long were you in contact with the COVID-19 patient?" (e.g., a few seconds, passed by person, a few minutes, live with the patient)     Total time maybe 45 minutes 5. DATE of CONTACT: "When did you have contact with a COVID-19 patient?" (e.g., how many days ago)     2 weeks ago today 6. TRAVEL: "Have  you traveled out of the country recently?" If so, "When and where?"     * Also ask about out-of-state travel, since the CDC has identified some high risk cities for community spread in the [place].     * Note: Travel becomes less relevant if there is widespread community transmission where the patient lives.     No 7. COMMUNITY SPREAD: "Are there lots of cases or COVID-19 (community spread) where you live?" (See public health department website, if unsure)   * MAJOR community spread: high number of cases; numbers of cases are increasing; many people hospitalized.   * MINOR community spread: low number of cases; not increasing; few or no people hospitalized     No 8. SYMPTOMS: "Do you have any symptoms?" (e.g., fever, cough, breathing difficulty)     Fatigue, low grade fever twice, diarrhea once in last 2 1/2 weeks, mild sore throat and mild headache 9. PREGNANCY OR POSTPARTUM: "Is there any chance you are pregnant?" "When was your last menstrual period?" "Did you deliver in the last 2 weeks?"     N/A 10. HIGH RISK: "Do you have any heart or lung problems? Do you have a weak immune system?" (e.g., CHF, COPD, asthma, HIV positive, chemotherapy, renal failure, diabetes mellitus, sickle cell anemia)      No  Protocols used: CORONAVIRUS (COVID-19) EXPOSURE-A-AH

## 2019-03-01 NOTE — Telephone Encounter (Signed)
Will you route to Dr. Carmelia Roller.

## 2019-09-15 ENCOUNTER — Ambulatory Visit (INDEPENDENT_AMBULATORY_CARE_PROVIDER_SITE_OTHER)
Admission: RE | Admit: 2019-09-15 | Discharge: 2019-09-15 | Disposition: A | Discharge status: Home / Self Care | Payer: Commercial Managed Care - PPO | Source: Ambulatory Visit

## 2019-09-15 DIAGNOSIS — R112 Nausea with vomiting, unspecified: Secondary | ICD-10-CM | POA: Diagnosis not present

## 2019-09-15 DIAGNOSIS — Z20828 Contact with and (suspected) exposure to other viral communicable diseases: Secondary | ICD-10-CM | POA: Diagnosis not present

## 2019-09-15 DIAGNOSIS — Z20822 Contact with and (suspected) exposure to covid-19: Secondary | ICD-10-CM

## 2019-09-15 MED ORDER — PANTOPRAZOLE SODIUM 20 MG PO TBEC: 40.0000 mg | DELAYED_RELEASE_TABLET | Freq: Every day | ORAL | 0 refills | Status: DC

## 2019-09-15 MED ORDER — ONDANSETRON 4 MG PO TBDP: 4.0000 mg | ORAL_TABLET | Freq: Three times a day (TID) | ORAL | 0 refills | Status: DC | PRN

## 2019-09-15 NOTE — ED Provider Notes (Signed)
Virtual Visit via Video Note:  [name]  initiated request for Telemedicine visit with [facility] team. I connected with [name]  on 09/15/2019 at 10:42 AM  for a synchronized telemedicine visit using a video enabled HIPPA compliant telemedicine application. I verified that I am speaking with [name]  using two identifiers. [name], PA-C  was physically located in a [facility] site and [name] was located at a different location.   The limitations of evaluation and management by telemedicine as well as the availability of in-person appointments were discussed. Patient was informed that he  may incur a bill ( including co-pay) for this virtual visit encounter. [name]  expressed understanding and gave verbal consent to proceed with virtual visit.     History of Present Illness:Sean Mckenzie  is a 53 y.o. male presents with sore throat, cough, nausea since yesterday.  Patient endorsing single episode of emesis without bile/blood 30 minutes prior to appointment today.  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  Patient denies any other known contacts, and states he was able to wear appropriate PPE during that encounter.  Patient endorsing generalized malaise: Denies fever, chest pain, shortness of breath.  Patient does have history of GERD, though does not take any for this.  Patient denies heartburn, abdominal pain.   Review of Systems  Constitutional: Positive for malaise/fatigue. Negative for chills and fever.  HENT: Positive for congestion and sore throat. Negative for ear pain.   Eyes: Negative for pain and discharge.  Respiratory: Positive for cough. Negative for hemoptysis, sputum production and shortness of breath.   Cardiovascular: Negative for chest pain and palpitations.   Gastrointestinal: Positive for nausea and vomiting. Negative for abdominal pain, blood in stool, constipation, diarrhea and heartburn.  Musculoskeletal: Negative for myalgias and neck pain.  Neurological: Negative for sensory change and headaches.     Past Medical History:  Diagnosis Date  . GERD (gastroesophageal reflux disease)   . History of chicken pox     No Known Allergies      Observations/Objective: 53 year old male sitting in no acute distress.  Patient is able to speak in full sentences without coughing, sneezing, wheezing.  Patient is not tachypneic, though does appear mildly fatigued without pallor.  Assessment and Plan: Given patient's profession, high exposure risk, Covid testing to be obtained.  Will trial Zofran for nausea as patient is currently able to keep down fluids by mouth.  We will also start PPIs patient does have history of GERD which could be contributing to nausea.  Patient early in course of illness: We will continue to monitor and treat symptomatically.  Return precautions discussed, patient verbalized understanding and is agreeable to plan.  Follow Up Instructions: If Covid positive, patient to continue symptom management at home.  If Covid negative, patient to seek in person evaluation for persistent, worsening symptoms.   I discussed the assessment and treatment plan with the patient. The patient was provided an opportunity to ask questions and all were answered. The patient agreed with the plan and demonstrated an understanding of the instructions.   The patient was advised to call back or seek an in-person evaluation if the symptoms worsen or if the condition fails to improve as anticipated.  I provided 15 minutes of non-face-to-face time during this encounter.    [place]  Hall-Potvin, PA-C  09/15/2019 10:42 AM        Hall-Potvin, [place], PA-C 09/15/19 1042

## 2019-09-15 NOTE — Discharge Instructions (Addendum)
Go to [facility] for [employer] testing. Take Zofran under your tongue as needed for nausea. Recommend taking Protonix once daily for the next 1 to 2 weeks as underlying GERD could be contributory. Recommend in person evaluation for worsening nausea, vomiting, inability keep down water/food, weakness, chest pain, shortness of breath.

## 2020-01-10 ENCOUNTER — Encounter: Payer: Self-pay | Admitting: Family Medicine

## 2020-05-21 ENCOUNTER — Telehealth: Payer: Self-pay | Admitting: Family Medicine

## 2020-05-21 NOTE — Telephone Encounter (Signed)
Called the patient to inform that he would need an appointment. Offered an appointment with PCP for Friday, he declined and said would [facility] his [facility] home from work.

## 2020-05-21 NOTE — Telephone Encounter (Signed)
Patient found a tick on back , thinks its been there a couple days , notice on Monday . Patient wants to know if he should be seen or can a script can be sent in . Please advise

## 2020-05-28 DIAGNOSIS — M25519 Pain in unspecified shoulder: Secondary | ICD-10-CM | POA: Diagnosis not present

## 2020-06-05 DIAGNOSIS — M25519 Pain in unspecified shoulder: Secondary | ICD-10-CM | POA: Diagnosis not present

## 2020-06-18 DIAGNOSIS — M25519 Pain in unspecified shoulder: Secondary | ICD-10-CM | POA: Diagnosis not present

## 2020-07-21 DIAGNOSIS — M25519 Pain in unspecified shoulder: Secondary | ICD-10-CM | POA: Diagnosis not present

## 2020-11-06 DIAGNOSIS — M25519 Pain in unspecified shoulder: Secondary | ICD-10-CM | POA: Diagnosis not present

## 2021-07-31 ENCOUNTER — Other Ambulatory Visit: Payer: Self-pay

## 2021-07-31 ENCOUNTER — Encounter: Payer: Self-pay | Admitting: Family Medicine

## 2021-07-31 ENCOUNTER — Ambulatory Visit (INDEPENDENT_AMBULATORY_CARE_PROVIDER_SITE_OTHER): Payer: BC Managed Care – PPO | Admitting: Family Medicine

## 2021-07-31 VITALS — BP 132/84 | HR 67 | Resp 18 | Ht 68.0 in | Wt 173.5 lb

## 2021-07-31 DIAGNOSIS — R109 Unspecified abdominal pain: Secondary | ICD-10-CM | POA: Diagnosis not present

## 2021-07-31 DIAGNOSIS — Z1211 Encounter for screening for malignant neoplasm of colon: Secondary | ICD-10-CM | POA: Diagnosis not present

## 2021-07-31 LAB — CBC
HCT: 39.3 % (ref 39.0–52.0)
Hemoglobin: 13.1 g/dL (ref 13.0–17.0)
MCHC: 33.3 g/dL (ref 30.0–36.0)
MCV: 91.4 fl (ref 78.0–100.0)
Platelets: 273 10*3/uL (ref 150.0–400.0)
RBC: 4.3 Mil/uL (ref 4.22–5.81)
RDW: 12.4 % (ref 11.5–15.5)
WBC: 4.3 10*3/uL (ref 4.0–10.5)

## 2021-07-31 LAB — COMPREHENSIVE METABOLIC PANEL WITH GFR
ALT: 55 U/L — ABNORMAL HIGH (ref 0–53)
AST: 30 U/L (ref 0–37)
Albumin: 4.1 g/dL (ref 3.5–5.2)
Alkaline Phosphatase: 43 U/L (ref 39–117)
BUN: 13 mg/dL (ref 6–23)
CO2: 29 meq/L (ref 19–32)
Calcium: 9.1 mg/dL (ref 8.4–10.5)
Chloride: 102 meq/L (ref 96–112)
Creatinine, Ser: 0.8 mg/dL (ref 0.40–1.50)
GFR: 101.41 mL/min
Glucose, Bld: 110 mg/dL — ABNORMAL HIGH (ref 70–99)
Potassium: 4.4 meq/L (ref 3.5–5.1)
Sodium: 138 meq/L (ref 135–145)
Total Bilirubin: 0.5 mg/dL (ref 0.2–1.2)
Total Protein: 6.3 g/dL (ref 6.0–8.3)

## 2021-07-31 LAB — CK: Total CK: 258 U/L — ABNORMAL HIGH (ref 7–232)

## 2021-07-31 NOTE — Progress Notes (Signed)
Chief Complaint  Patient presents with   Abdominal Pain    Subjective: Patient is a 55 y.o. male here for upper abd discomfort.  Ran 116 miles over this past weekend. 3 d ago started having upper abd discomfort and bloating. [employer] brought food and he ate macaroni and a veggie casserole which he does not usually consume. He has had some dyspepsia and coughing. He has a hx of reflux and started taking Nexium as of yesterday. No myalgias, headaches, fevers, urination issues, diarrhea, N/V.   Past Medical History:  Diagnosis Date   GERD (gastroesophageal reflux disease)    History of chicken pox     Objective: BP 132/84   Pulse 67   Resp 18   Ht 5\' 8"  (1.727 m)   Wt 173 lb 8 oz (78.7 kg)   SpO2 99%   BMI 26.38 kg/m  General: Awake, appears stated age HEENT: MMM Heart: RRR Abd: BS+, S, NT, ND Lungs: CTAB, no rales, wheezes or rhonchi. No accessory muscle use Psych: Age appropriate judgment and insight, normal affect and mood  Assessment and Plan: Abdominal discomfort - Plan: CK (Creatine Kinase), CBC, Comprehensive metabolic panel  Screen for colon cancer - Plan: Ambulatory referral to Gastroenterology  Likely reflux flared by [employer]. Cont Nexium prn. Ck labs to r/o rhabdo, though less likely.  F/u for CPE in 2ish months.  The patient voiced understanding and agreement to the plan.  [place], DO 07/31/21  12:10 PM

## 2021-07-31 NOTE — Patient Instructions (Addendum)
Give [place] 2-3 business days to get the results of your labs back.   Stay on the Nexium, I do think this is related to the casserole.   If you are feeling good, go ahead and run/exercise. Listen to your body.   Let us know if you need anything.

## 2021-08-03 ENCOUNTER — Ambulatory Visit: Payer: Commercial Managed Care - PPO | Admitting: Family Medicine

## 2021-09-16 ENCOUNTER — Encounter: Payer: BC Managed Care – PPO | Admitting: Family Medicine

## 2021-09-28 ENCOUNTER — Encounter: Payer: Self-pay | Admitting: Family Medicine

## 2021-09-28 ENCOUNTER — Telehealth (INDEPENDENT_AMBULATORY_CARE_PROVIDER_SITE_OTHER): Payer: BC Managed Care – PPO | Admitting: Family Medicine

## 2021-09-28 VITALS — BP 148/88 | Temp 97.8°F | Ht 68.0 in

## 2021-09-28 DIAGNOSIS — U071 COVID-19: Secondary | ICD-10-CM | POA: Diagnosis not present

## 2021-09-28 MED ORDER — NIRMATRELVIR/RITONAVIR (PAXLOVID)TABLET: 3.0000 | ORAL_TABLET | Freq: Two times a day (BID) | ORAL | 0 refills | Status: AC

## 2021-09-28 NOTE — Progress Notes (Signed)
Sean Hajjar T. Tequilla Cousineau, MD Primary Care and Sports Medicine [facility] [address] [place], [address] Phone: [phone]  FAX: [phone]  Sean Mckenzie - 55 y.o. male  MRN [id number]  Date of Birth: 1966-03-24  Visit Date: 09/28/2021  PCP: Sharlene Dory, DO  Referred by: Sharlene Dory*  Virtual Visit via Video Note:  I connected with  Sean Mckenzie on 09/28/2021 10:40 AM EST by a video enabled telemedicine application and verified that I am speaking with the correct person using two identifiers.   Location patient: home computer, tablet, or smartphone Location provider: work or home office Consent: Verbal consent directly obtained from [name]. Persons participating in the virtual visit: patient, provider  I discussed the limitations of evaluation and management by telemedicine and the availability of in person appointments. The patient expressed understanding and agreed to proceed.  Chief Complaint  Patient presents with   Covid Positive    Home test this morning and has PCR test schedule later today. Symptoms started Thursday.   Post Nasal Drip   Nasal Congestion   Fever   Generalized Body Aches   Headache    History of Present Illness:  He reports some sinus burning, and he is here for video visit.  His wife was initially sick, and then he got sick later.  Date of onset was last Thursday.  WIfe sick for a few days.  Last Thursday, 102 fever. Thursday and then to Friday.  Felt like the flu. Hydrated and all weekend long. Feels better, and throat is not hurting a lot.   Feels it back in the back of his difficulty breathing.  His breathing does seem to be worse today.  Covid today.   Some gi over the weekend.  Some nausea.  Post-nasal drip.  No neuro.     Review of Systems as above: See pertinent positives and pertinent negatives per HPI No acute distress verbally    Observations/Objective/Exam:  An attempt was made to discern vital signs over the phone and per patient if applicable and possible.   General:    Alert, Oriented, appears well and in no acute distress  Pulmonary:     On inspection no signs of respiratory distress.  Psych / Neurological:     Pleasant and cooperative.  Assessment and Plan:    ICD-10-CM   1. COVID-19  U07.1      Confirmed COVID-19.  His only risk factor is age.  Nevertheless, he is 52 and he is feeling somewhat more short of breath today, and I am going to initiate Paxlovid therapy.  Otherwise continue with basic supportive care.  For his work as a Adult nurse, they are going to require a negative PCR COVID test prior to return to work.  I discussed the assessment and treatment plan with the patient. The patient was provided an opportunity to ask questions and all were answered. The patient agreed with the plan and demonstrated an understanding of the instructions.   The patient was advised to call back or seek an in-person evaluation if the symptoms worsen or if the condition fails to improve as anticipated.  Follow-up: prn unless noted otherwise below No follow-ups on file.  Meds ordered this encounter  Medications   nirmatrelvir/ritonavir EUA (PAXLOVID) 20 x 150 MG & 10 x 100MG  TABS    Sig: Take 3 tablets by mouth 2 (two) times daily for 5 days. (Take nirmatrelvir 150 mg two  tablets twice daily for 5 days and ritonavir 100 mg one tablet twice daily for 5 days) Patient GFR is 101    Dispense:  30 tablet    Refill:  0   No orders of the defined types were placed in this encounter.   Signed,  Elpidio Galea. Brittaney Beaulieu, MD

## 2021-10-13 ENCOUNTER — Encounter: Payer: BC Managed Care – PPO | Admitting: Family Medicine

## 2022-01-13 DIAGNOSIS — Z20822 Contact with and (suspected) exposure to covid-19: Secondary | ICD-10-CM | POA: Diagnosis not present

## 2022-01-13 DIAGNOSIS — J09X2 Influenza due to identified novel influenza A virus with other respiratory manifestations: Secondary | ICD-10-CM | POA: Diagnosis not present

## 2022-01-13 DIAGNOSIS — Z03818 Encounter for observation for suspected exposure to other biological agents ruled out: Secondary | ICD-10-CM | POA: Diagnosis not present

## 2022-01-13 DIAGNOSIS — J029 Acute pharyngitis, unspecified: Secondary | ICD-10-CM | POA: Diagnosis not present

## 2022-02-05 DIAGNOSIS — R059 Cough, unspecified: Secondary | ICD-10-CM | POA: Diagnosis not present
